# Patient Record
Sex: Female | Born: 1965 | Race: Asian | Hispanic: No | Marital: Married | Smoking: Never smoker
Health system: Southern US, Community
[De-identification: ages and names within clinical notes are randomized; demographics above are authoritative.]

## PROBLEM LIST (undated history)

## (undated) DIAGNOSIS — N200 Calculus of kidney: Secondary | ICD-10-CM

## (undated) DIAGNOSIS — T7840XA Allergy, unspecified, initial encounter: Secondary | ICD-10-CM

## (undated) DIAGNOSIS — M8580 Other specified disorders of bone density and structure, unspecified site: Secondary | ICD-10-CM

## (undated) DIAGNOSIS — Z86010 Personal history of colonic polyps: Secondary | ICD-10-CM

## (undated) DIAGNOSIS — M810 Age-related osteoporosis without current pathological fracture: Secondary | ICD-10-CM

## (undated) HISTORY — DX: Calculus of kidney: N20.0

## (undated) HISTORY — DX: Allergy, unspecified, initial encounter: T78.40XA

## (undated) HISTORY — DX: Other specified disorders of bone density and structure, unspecified site: M85.80

## (undated) HISTORY — DX: Age-related osteoporosis without current pathological fracture: M81.0

## (undated) HISTORY — DX: Personal history of colonic polyps: Z86.010

---

## 2014-01-21 ENCOUNTER — Ambulatory Visit: Payer: BC Managed Care – PPO | Admitting: Gynecology

## 2014-01-22 NOTE — Progress Notes (Signed)
NO SHOW

## 2014-02-04 ENCOUNTER — Ambulatory Visit (INDEPENDENT_AMBULATORY_CARE_PROVIDER_SITE_OTHER): Payer: BC Managed Care – PPO | Admitting: Gynecology

## 2014-02-04 ENCOUNTER — Encounter: Payer: Self-pay | Admitting: Gynecology

## 2014-02-04 ENCOUNTER — Other Ambulatory Visit (HOSPITAL_COMMUNITY)
Admission: RE | Admit: 2014-02-04 | Discharge: 2014-02-04 | Disposition: A | Discharge status: Home / Self Care | Payer: BC Managed Care – PPO | Source: Ambulatory Visit | Attending: Gynecology | Admitting: Gynecology

## 2014-02-04 VITALS — BP 108/70 | Ht 63.5 in | Wt 115.0 lb

## 2014-02-04 DIAGNOSIS — M8580 Other specified disorders of bone density and structure, unspecified site: Secondary | ICD-10-CM | POA: Insufficient documentation

## 2014-02-04 DIAGNOSIS — Z1151 Encounter for screening for human papillomavirus (HPV): Secondary | ICD-10-CM | POA: Insufficient documentation

## 2014-02-04 DIAGNOSIS — N926 Irregular menstruation, unspecified: Secondary | ICD-10-CM

## 2014-02-04 DIAGNOSIS — M949 Disorder of cartilage, unspecified: Secondary | ICD-10-CM

## 2014-02-04 DIAGNOSIS — M899 Disorder of bone, unspecified: Secondary | ICD-10-CM

## 2014-02-04 DIAGNOSIS — Z01419 Encounter for gynecological examination (general) (routine) without abnormal findings: Secondary | ICD-10-CM

## 2014-02-04 DIAGNOSIS — N951 Menopausal and female climacteric states: Secondary | ICD-10-CM

## 2014-02-04 NOTE — Progress Notes (Signed)
Samantha Peterson 1966/07/31 [id number]   History:    48 y.o.  for annual gyn exam who is a new patient to the practice. Patient's last gynecological exam was over 6 years ago in [place] she denied any past history of abnormal Pap smear. She is a gravida 4 para 2 AB 2 (first 2 pregnancies resulted in premature birth at 41 months and fetuses died. Third and fourth pregnancies were delivered vaginally one in 9 months and the other one at 7 months gestation. Her husband has had a vasectomy. She states that last year and now for the past 2 months she has been having 2 periods per month. She is taking no medication at the present time. She did state Demerol A. approximately 4 years ago they did a bone density study and: She was osteopenic. She denies any vasomotor symptoms.  Past medical history,surgical history, family history and social history were all reviewed and documented in the EPIC chart.  Gynecologic History Patient's last menstrual period was 01/01/2014. Contraception: vasectomy Last Pap: 6 years ago. Results were: Normal in [place] according to patient Last mammogram: 5 years ago. Results were: Normal  Obstetric History OB History  Gravida Para Term Preterm AB SAB TAB Ectopic Multiple Living  4 2   2 2    2     # Outcome Date GA Lbr Len/2nd Weight Sex Delivery Anes PTL Lv  4 SAB           3 SAB           2 PAR           1 PAR                ROS: A ROS was performed and pertinent positives and negatives are included in the history.  GENERAL: No fevers or chills. HEENT: No change in vision, no earache, sore throat or sinus congestion. NECK: No pain or stiffness. CARDIOVASCULAR: No chest pain or pressure. No palpitations. PULMONARY: No shortness of breath, cough or wheeze. GASTROINTESTINAL: No abdominal pain, nausea, vomiting or diarrhea, melena or bright red blood per rectum. GENITOURINARY: No urinary frequency, urgency, hesitancy or dysuria. MUSCULOSKELETAL: No joint or muscle pain, no back  pain, no recent trauma. DERMATOLOGIC: No rash, no itching, no lesions. ENDOCRINE: No polyuria, polydipsia, no heat or cold intolerance. No recent change in weight. HEMATOLOGICAL: No anemia or easy bruising or bleeding. NEUROLOGIC: No headache, seizures, numbness, tingling or weakness. PSYCHIATRIC: No depression, no loss of interest in normal activity or change in sleep pattern.     Exam: chaperone present  BP 108/70  Ht 5' 3.5" (1.613 m)  Wt 115 lb (52.164 kg)  BMI 20.05 kg/m2  LMP 01/01/2014  Body mass index is 20.05 kg/(m^2).  General appearance : Well developed well nourished female. No acute distress HEENT: Neck supple, trachea midline, no carotid bruits, no thyroidmegaly Lungs: Clear to auscultation, no rhonchi or wheezes, or rib retractions  Heart: Regular rate and rhythm, no murmurs or gallops Breast:Examined in sitting and supine position were symmetrical in appearance, no palpable masses or tenderness,  no skin retraction, no nipple inversion, no nipple discharge, no skin discoloration, no axillary or supraclavicular lymphadenopathy Abdomen: no palpable masses or tenderness, no rebound or guarding Extremities: no edema or skin discoloration or tenderness  Pelvic:  Bartholin, Urethra, Skene Glands: Within normal limits             Vagina: No gross lesions or discharge  Cervix: No gross lesions or  discharge  Uterus  anteverted, normal size, shape and consistency, non-tender and mobile  Adnexa  Without masses or tenderness  Anus and perineum  normal   Rectovaginal  normal sphincter tone without palpated masses or tenderness             Hemoccult not indicated   Patient was counseled for an endometrial biopsy after the Pap smear was obtained. The cervix was cleansed with Betadine solution. A sterile Pipelle was introduced into the uterine cavity and tissue submitted for histological evaluation.  Assessment/Plan:  48 y.o. female for annual exam who is perimenopausal at the age  of 37 with no vasomotor symptoms irregular cycles whereby she has been having 2 per month. To rule out endometrial hyperplasia and endometrial biopsy was done today. To complete the evaluation for irregular bleeding she'll return back to the office next week for sonohysterogram. Will check an [facility] today. Patient's PCP will be drawn her blood work. Requisition to schedule mammogram was provided. We discussed importance of monthly self breast examination. We discussed importance of calcium and vitamin D and regular exercise for osteoporosis prevention. Past HPV screen done today.  Note: This dictation was prepared with  Dragon/digital dictation along withSmart phrase technology. Any transcriptional errors that result from this process are unintentional.   Terrance Mass MD, 12:03 PM 02/04/2014

## 2014-02-04 NOTE — Patient Instructions (Signed)
Transvaginal Ultrasound Transvaginal ultrasound is a pelvic ultrasound, using a metal probe that is placed in the vagina, to look at a women's female organs. Transvaginal ultrasound is a method of seeing inside the pelvis of a woman. The ultrasound machine sends out sound waves from the transducer (probe). These sound waves bounce off body structures (like an echo) to create a picture. The picture shows up on a monitor. It is called transvaginal because the probe is inserted into the vagina. There should be very little discomfort from the vaginal probe. This test can also be used during pregnancy. Endovaginal ultrasound is another name for a transvaginal ultrasound. In a transabdominal ultrasound, the probe is placed on the outside of the belly. This method gives pictures that are lower quality than pictures from the transvaginal technique. Transvaginal ultrasound is used to look for problems of the female genital tract. Some such problems include:  Infertility problems.  Congenital (birth defect) malformations of the uterus and ovaries.  Tumors in the uterus.  Abnormal bleeding.  Ovarian tumors and cysts.  Abscess (inflamed tissue around pus) in the pelvis.  Unexplained abdominal or pelvic pain.  Pelvic infection. DURING PREGNANCY, TRANSVAGINAL ULTRASOUND MAY BE USED TO LOOK AT:  Normal pregnancy.  Ectopic pregnancy (pregnancy outside the uterus).  Fetal heartbeat.  Abnormalities in the pelvis, that are not seen well with transabdominal ultrasound.  Suspected twins or multiples.  Impending miscarriage.  Problems with the cervix (incompetent cervix, not able to stay closed and hold the baby).  When doing an amniocentesis (removing fluid from the pregnancy sac, for testing).  Looking for abnormalities of the baby.  Checking the growth, development, and age of the fetus.  Measuring the amount of fluid in the amniotic sac.  When doing an external version of the baby (moving  baby into correct position).  Evaluating the baby for problems in high risk pregnancies (biophysical profile).  Suspected fetal demise (death). Sometimes a special ultrasound method called Saline Infusion Sonography (SIS) is used for a more accurate look at the uterus. Sterile saline (salt water) is injected into the uterus of non-pregnant patients to see the inside of the uterus better. SIS is not used on pregnant women. The vaginal probe can also assist in obtaining biopsies of abnormal areas, in draining fluid from cysts on the ovary, and in finding IUDs (intrauterine device, birth control) that cannot be located. PREPARATION FOR TEST A transvaginal ultrasound is done with the bladder empty. The transabdominal ultrasound is done with your bladder full. You may be asked to drink several glasses of water before that exam. Sometimes, a transabdominal ultrasound is done just after a transvaginal ultrasound, to look at organs in your abdomen. PROCEDURE  You will lie down on a table, with your knees bent and your feet in foot holders. The probe is covered with a condom. A sterile lubricant is put into the vagina and on the probe. The lubricant helps transmit the sound waves and avoid irritating the vagina. Your caregiver will move the probe inside the vaginal cavity to scan the pelvic structures. A normal test will show a normal pelvis and normal contents. An abnormal test will show abnormalities of the pelvis, placenta, or baby. ABNORMAL RESULTS MAY BE DUE TO:  Growths or tumors in the:  Uterus.  Ovaries.  Vagina.  Other pelvic structures.  Non-cancerous growths of the uterus and ovaries.  Twisting of the ovary, cutting off blood supply to the ovary (ovarian torsion).  Areas of infection, including:  Pelvic  inflammatory disease.  Abscess in the pelvis.  Locating an IUD. PROBLEMS FOUND IN PREGNANT WOMEN MAY INCLUDE:  Ectopic pregnancy (pregnancy outside the uterus).  Multiple  pregnancies.  Early dilation (opening) of the cervix. This may indicate an incompetent cervix and early delivery.  Impending miscarriage.  Fetal death.  Problems with the placenta, including:  Placenta has grown over the opening of the womb (placenta previa).  Placenta has separated early in the womb (placental abruption).  Placenta grows into the muscle of the uterus (placenta accreta).  Tumors of pregnancy, including gestational trophoblastic disease. This is an abnormal pregnancy, with no fetus. The uterus is filled with many grape-like cysts that could sometimes be cancerous.  Incorrect position of the fetus (breech, vertex).  Intrauterine fetal growth retardation (IUGR) (poor growth in the womb).  Fetal abnormalities or infection. RISKS AND COMPLICATIONS There are no known risks to the ultrasound procedure. There is no X-ray used when doing an ultrasound. Document Released: 09/05/2004 Document Revised: 12/17/2011 Document Reviewed: 08/24/2009 [facility] Patient Information 2014 [place], [place].  Perimenopause Perimenopause is the time when your body begins to move into the menopause (no menstrual period for 12 straight months). It is a natural process. Perimenopause can begin 2 8 years before the menopause and usually lasts for 1 year after the menopause. During this time, your ovaries may or may not produce an egg. The ovaries vary in their production of estrogen and progesterone hormones each month. This can cause irregular menstrual periods, difficulty getting pregnant, vaginal bleeding between periods, and uncomfortable symptoms. CAUSES  Irregular production of the ovarian hormones, estrogen and progesterone, and not ovulating every month.  Other causes include:  Tumor of the pituitary gland in the brain.  Medical disease that affects the ovaries.  Radiation treatment.  Chemotherapy.  Unknown causes.  Heavy smoking and excessive alcohol intake can bring on  perimenopause sooner. SIGNS AND SYMPTOMS   Hot flashes.  Night sweats.  Irregular menstrual periods.  Decreased sex drive.  Vaginal dryness.  Headaches.  Mood swings.  Depression.  Memory problems.  Irritability.  Tiredness.  Weight gain.  Trouble getting pregnant.  The beginning of losing bone cells (osteoporosis).  The beginning of hardening of the arteries (atherosclerosis). DIAGNOSIS  Your health care provider will make a diagnosis by analyzing your age, menstrual history, and symptoms. He or she will do a physical exam and note any changes in your body, especially your female organs. Female hormone tests may or may not be helpful depending on the amount of female hormones you produce and when you produce them. However, other hormone tests may be helpful to rule out other problems. TREATMENT  In some cases, no treatment is needed. The decision on whether treatment is necessary during the perimenopause should be made by you and your health care provider based on how the symptoms are affecting you and your lifestyle. Various treatments are available, such as:  Treating individual symptoms with a specific medicine for that symptom.  Herbal medicines that can help specific symptoms.  Counseling.  Group therapy. HOME CARE INSTRUCTIONS   Keep track of your menstrual periods (when they occur, how heavy they are, how long between periods, and how long they last) as well as your symptoms and when they started.  Only take over-the-counter or prescription medicines as directed by your health care provider.  Sleep and rest.  Exercise.  Eat a diet that contains calcium (good for your bones) and soy (acts like the estrogen hormone).  Do not  smoke.  Avoid alcoholic beverages.  Take vitamin supplements as recommended by your health care provider. Taking vitamin E may help in certain cases.  Take calcium and vitamin D supplements to help prevent bone loss.  Group  therapy is sometimes helpful.  Acupuncture may help in some cases. SEEK MEDICAL CARE IF:   You have questions about any symptoms you are having.  You need a referral to a specialist (gynecologist, psychiatrist, or psychologist). SEEK IMMEDIATE MEDICAL CARE IF:   You have vaginal bleeding.  Your period lasts longer than 8 days.  Your periods are recurring sooner than 21 days.  You have bleeding after intercourse.  You have severe depression.  You have pain when you urinate.  You have severe headaches.  You have vision problems. Document Released: 11/01/2004 Document Revised: 07/15/2013 Document Reviewed: 04/23/2013 [facility] Patient Information 2014 [place], [place]. Endometrial Biopsy Endometrial biopsy is a procedure in which a tissue sample is taken from inside the uterus. The tissue sample is then looked at under a microscope to see if the tissue is normal or abnormal. The endometrium is the lining of the uterus. This procedure helps determine where you are in your menstrual cycle and how hormone levels are affecting the lining of the uterus. This procedure may also be used to evaluate uterine bleeding or to diagnose endometrial cancer, tuberculosis, polyps, or inflammatory conditions.  LET [facility] CARE PROVIDER KNOW ABOUT:  Any allergies you have.  All medicines you are taking, including vitamins, herbs, eye drops, creams, and over-the-counter medicines.  Previous problems you or members of your family have had with the use of anesthetics.  Any blood disorders you have.  Previous surgeries you have had.  Medical conditions you have.  Possibility of pregnancy. RISKS AND COMPLICATIONS Generally, this is a safe procedure. However, as with any procedure, complications can occur. Possible complications include:  Bleeding.  Pelvic infection.  Puncture of the uterine wall with the biopsy device (rare). BEFORE THE PROCEDURE   Keep a record of your menstrual cycles  as directed by your health care provider. You may need to schedule your procedure for a specific time in your cycle.  You may want to bring a sanitary pad to wear home after the procedure.  Arrange for someone to drive you home after the procedure if you will be given a medicine to help you relax (sedative). PROCEDURE   You may be given a sedative to relax you.  You will lie on an exam table with your feet and legs supported as in a pelvic exam.  Your health care provider will insert an instrument (speculum) into your vagina to see your cervix.  Your cervix will be cleansed with an antiseptic solution. A medicine (local anesthetic) will be used to numb the cervix.  A forceps instrument (tenaculum) will be used to hold your cervix steady for the biopsy.  A thin, rodlike instrument (uterine sound) will be inserted through your cervix to determine the length of your uterus and the location where the biopsy sample will be removed.  A thin, flexible tube (catheter) will be inserted through your cervix and into the uterus. The catheter is used to collect the biopsy sample from your endometrial tissue.  The catheter and speculum will then be removed, and the tissue sample will be sent to a lab for examination. AFTER THE PROCEDURE  You will rest in a recovery area until you are ready to go home.  You may have mild cramping and a small  amount of vaginal bleeding for a few days after the procedure. This is normal.  Make sure you find out how to get your test results. Document Released: 01/25/2005 Document Revised: 05/27/2013 Document Reviewed: 03/11/2013 [facility] Patient Information 2014 [place], [place].

## 2014-02-04 NOTE — Addendum Note (Signed)
Addended by: Su Grand A on: 02/04/2014 12:09 PM   Modules accepted: Orders

## 2014-02-05 LAB — FOLLICLE STIMULATING HORMONE: FSH: 3.1 m[IU]/mL

## 2014-02-15 ENCOUNTER — Other Ambulatory Visit: Payer: Self-pay | Admitting: Gynecology

## 2014-02-15 DIAGNOSIS — N921 Excessive and frequent menstruation with irregular cycle: Secondary | ICD-10-CM

## 2014-02-15 DIAGNOSIS — N926 Irregular menstruation, unspecified: Secondary | ICD-10-CM

## 2014-02-17 ENCOUNTER — Other Ambulatory Visit: Payer: BC Managed Care – PPO

## 2014-02-17 ENCOUNTER — Ambulatory Visit: Payer: BC Managed Care – PPO | Admitting: Gynecology

## 2014-02-22 ENCOUNTER — Other Ambulatory Visit: Payer: BC Managed Care – PPO

## 2014-02-22 ENCOUNTER — Ambulatory Visit: Payer: BC Managed Care – PPO | Admitting: Gynecology

## 2014-04-28 ENCOUNTER — Ambulatory Visit: Payer: BC Managed Care – PPO | Admitting: Gynecology

## 2014-04-28 ENCOUNTER — Other Ambulatory Visit: Payer: BC Managed Care – PPO

## 2014-04-28 ENCOUNTER — Telehealth: Payer: Self-pay | Admitting: Gynecology

## 2014-04-28 NOTE — Telephone Encounter (Signed)
04/28/14-Pt was late for her [facility] so I called her to see if she was coming. With the help of a [place] interpreter it was brought out that patient had forgotten about appt even though the phone tree showed someone answered the reminder call. Pt was informed of $50.00 fee through the interpreter but said she doesn't want to proceed with the test and she was doing fine and will call if she wants to reschedule it/[facility]

## 2014-06-01 ENCOUNTER — Encounter: Payer: Self-pay | Admitting: Internal Medicine

## 2014-06-01 ENCOUNTER — Ambulatory Visit (INDEPENDENT_AMBULATORY_CARE_PROVIDER_SITE_OTHER): Payer: No Typology Code available for payment source | Admitting: Internal Medicine

## 2014-06-01 VITALS — BP 112/64 | HR 60 | Temp 98.9°F | Ht 63.25 in | Wt 114.0 lb

## 2014-06-01 DIAGNOSIS — I8390 Asymptomatic varicose veins of unspecified lower extremity: Secondary | ICD-10-CM

## 2014-06-01 DIAGNOSIS — M899 Disorder of bone, unspecified: Secondary | ICD-10-CM

## 2014-06-01 DIAGNOSIS — R202 Paresthesia of skin: Secondary | ICD-10-CM

## 2014-06-01 DIAGNOSIS — Z23 Encounter for immunization: Secondary | ICD-10-CM

## 2014-06-01 DIAGNOSIS — R209 Unspecified disturbances of skin sensation: Secondary | ICD-10-CM

## 2014-06-01 DIAGNOSIS — M949 Disorder of cartilage, unspecified: Secondary | ICD-10-CM

## 2014-06-01 DIAGNOSIS — Z0000 Encounter for general adult medical examination without abnormal findings: Secondary | ICD-10-CM

## 2014-06-01 DIAGNOSIS — M8580 Other specified disorders of bone density and structure, unspecified site: Secondary | ICD-10-CM

## 2014-06-01 LAB — CBC WITH DIFFERENTIAL/PLATELET
BASOS PCT: 0.4 % (ref 0.0–3.0)
Basophils Absolute: 0 10*3/uL (ref 0.0–0.1)
EOS PCT: 3 % (ref 0.0–5.0)
Eosinophils Absolute: 0.2 10*3/uL (ref 0.0–0.7)
HCT: 30.6 % — ABNORMAL LOW (ref 36.0–46.0)
Hemoglobin: 9.6 g/dL — ABNORMAL LOW (ref 12.0–15.0)
LYMPHS PCT: 37.6 % (ref 12.0–46.0)
Lymphs Abs: 2.2 10*3/uL (ref 0.7–4.0)
MCHC: 31.5 g/dL (ref 30.0–36.0)
MCV: 73.6 fl — AB (ref 78.0–100.0)
MONO ABS: 0.4 10*3/uL (ref 0.1–1.0)
MONOS PCT: 6.7 % (ref 3.0–12.0)
NEUTROS PCT: 52.3 % (ref 43.0–77.0)
Neutro Abs: 3 10*3/uL (ref 1.4–7.7)
Platelets: 213 10*3/uL (ref 150.0–400.0)
RBC: 4.16 Mil/uL (ref 3.87–5.11)
RDW: 18.1 % — ABNORMAL HIGH (ref 11.5–15.5)
WBC: 5.8 10*3/uL (ref 4.0–10.5)

## 2014-06-01 NOTE — Patient Instructions (Signed)
Our office will contact you re: blood test and bone density scan results Use over the counter compression hose for symptomatic varicose veins.

## 2014-06-01 NOTE — Assessment & Plan Note (Addendum)
Reviewed adult health maintenance protocols.  Patient up to date with PAP and Pelvic.  Arrange screening mammogram and follow up DEXA scan.  Check screening labs.  Tdap given.  She declines flu vaccine.   I recommended colon cancer screening at age 48.  Her lower extremity symptoms likely secondary to varicose veins.  Patient will try using compression hose.

## 2014-06-01 NOTE — Progress Notes (Signed)
Pre visit review using our clinic review tool, if applicable. No additional management support is needed unless otherwise documented below in the visit note. 

## 2014-06-01 NOTE — Progress Notes (Signed)
Subjective:    Patient ID: Samantha Peterson, female    DOB: 1965-10-28, 48 y.o.   MRN: [id number]  HPI  48 year old Asian female to establish care and for routine physical. Patient denies any significant medical history.  She denies any chronic illnesses. She does have remote issues with kidney stones in the past.  Patient was seen by her gynecologist in June of 2015. Her Pap and pelvic were unremarkable.  Family and social history reviewed.  She has history of osteopenia. She takes calcium and vitamin D supplementation.  Her last DEXA 5 years ago.  No family hx of osteoporotic fractures.  History obtained through [place]. Review of Systems  Constitutional: Negative for activity change, appetite change and unexpected weight change.  Eyes: Negative for visual disturbance.  Respiratory: Negative for cough, chest tightness and shortness of breath.   Cardiovascular: Negative for chest pain.  Genitourinary: Negative for difficulty urinating.  Neurological: Negative for headaches. Occasional tingling in her hands  Gastrointestinal: Negative for abdominal pain, heartburn melena or hematochezia Psych: Negative for depression or anxiety Musculoskeletal: intermittent pain in lower legs bilaterally.  Worse with prolonged standing      Past Medical History  Diagnosis Date  . Osteopenia   . Kidney stones     History   Social History  . Marital Status: Married    Spouse Name: N/A    Number of Children: N/A  . Years of Education: N/A   Occupational History  . Self Employed    Social History Main Topics  . Smoking status: Never Smoker   . Smokeless tobacco: Not on file  . Alcohol Use: No  . Drug Use: No  . Sexual Activity: Not on file   Other Topics Concern  . 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  Family History  Problem Relation Age of Onset   . Lung cancer Mother   . Tuberculosis Mother   . Breast cancer Neg Hx   . Colon cancer Neg Hx     No Known Allergies  Current Outpatient Prescriptions on File Prior to Visit  Medication Sig Dispense Refill  . Ascorbic Acid (VITAMIN C) 100 MG tablet Take 100 mg by mouth daily.      . cholecalciferol (VITAMIN D) 1000 UNITS tablet Take 1,000 Units by mouth daily.      . Cyanocobalamin (VITAMIN B 12 PO) Take by mouth.       No current facility-administered medications on file prior to visit.    BP 112/64  Pulse 60  Temp(Src) 98.9 F (37.2 C) (Oral)  Ht 5' 3.25" (1.607 m)  Wt 114 lb (51.71 kg)  BMI 20.02 kg/m2       Objective:   Physical Exam  Constitutional: She is oriented to person, place, and time. She appears well-developed and well-nourished. No distress.  HENT:  Head: Normocephalic and atraumatic.  Right Ear: External ear normal.  Left Ear: External ear normal.  Mouth/Throat: Oropharynx is clear and moist.  Hearing is grossly normal  Eyes: EOM are normal. Pupils are equal, round, and reactive to light. No scleral icterus.  Neck: Normal range of motion. Neck supple. No thyromegaly present.  No carotid bruit  Cardiovascular: Normal rate, regular rhythm, normal heart sounds and intact distal pulses.   No murmur heard. Bilateral lower extremity varicose veins  Pulmonary/Chest: Effort normal and breath sounds normal. She has no wheezes.  Abdominal: Soft. Bowel sounds are normal. She exhibits no mass. There is no tenderness.  Musculoskeletal: She exhibits no edema.  Lymphadenopathy:    She has no cervical adenopathy.  Neurological: She is alert and oriented to person, place, and time. No cranial nerve deficit.  Negative Tinel's and Phalen's test  Skin: Skin is warm and dry. No rash noted.  Psychiatric: She has a normal mood and affect. Her behavior is normal.          Assessment & Plan:

## 2014-06-02 LAB — BASIC METABOLIC PANEL
BUN: 11 mg/dL (ref 6–23)
CHLORIDE: 102 meq/L (ref 96–112)
CO2: 28 mEq/L (ref 19–32)
CREATININE: 0.5 mg/dL (ref 0.4–1.2)
Calcium: 9.1 mg/dL (ref 8.4–10.5)
GFR: 127.19 mL/min (ref >60.00)
Glucose, Bld: 79 mg/dL (ref 70–99)
Potassium: 3.8 mEq/L (ref 3.5–5.1)
Sodium: 136 mEq/L (ref 135–145)

## 2014-06-02 LAB — LIPID PANEL
CHOLESTEROL: 161 mg/dL (ref 0–200)
HDL: 63.4 mg/dL (ref >39.00)
LDL Cholesterol: 81 mg/dL (ref 0–99)
NonHDL: 97.6
TRIGLYCERIDES: 82 mg/dL (ref 0.0–149.0)
Total CHOL/HDL Ratio: 3
VLDL: 16.4 mg/dL (ref 0.0–40.0)

## 2014-06-02 LAB — TSH: TSH: 2.15 u[IU]/mL (ref 0.35–4.50)

## 2014-06-02 LAB — HEPATIC FUNCTION PANEL
ALK PHOS: 47 U/L (ref 39–117)
ALT: 11 U/L (ref 0–35)
AST: 19 U/L (ref 0–37)
Albumin: 3.9 g/dL (ref 3.5–5.2)
BILIRUBIN TOTAL: 0.5 mg/dL (ref 0.2–1.2)
Bilirubin, Direct: 0.1 mg/dL (ref 0.0–0.3)
TOTAL PROTEIN: 6.8 g/dL (ref 6.0–8.3)

## 2014-06-02 LAB — VITAMIN D 25 HYDROXY (VIT D DEFICIENCY, FRACTURES): VITD: 41.42 ng/mL (ref 30.00–100.00)

## 2014-06-02 LAB — VITAMIN B12: Vitamin B-12: >1500 pg/mL — ABNORMAL HIGH (ref 211–911)

## 2014-06-03 ENCOUNTER — Inpatient Hospital Stay: Admission: RE | Admit: 2014-06-03 | Payer: No Typology Code available for payment source | Source: Ambulatory Visit

## 2014-06-03 NOTE — Addendum Note (Signed)
Addended by: Elmer Picker on: 06/03/2014 05:06 PM   Modules accepted: Orders

## 2014-06-04 ENCOUNTER — Other Ambulatory Visit: Payer: Self-pay | Admitting: Internal Medicine

## 2014-06-04 ENCOUNTER — Ambulatory Visit: Payer: No Typology Code available for payment source

## 2014-06-04 DIAGNOSIS — D649 Anemia, unspecified: Secondary | ICD-10-CM

## 2014-06-04 DIAGNOSIS — Z1231 Encounter for screening mammogram for malignant neoplasm of breast: Secondary | ICD-10-CM

## 2014-06-04 LAB — IBC PANEL
IRON: 17 ug/dL — AB (ref 42–145)
Iron: 17 ug/dL — ABNORMAL LOW (ref 42–145)
SATURATION RATIOS: 3.7 % — AB (ref 20.0–50.0)
Saturation Ratios: 3.7 % — ABNORMAL LOW (ref 20.0–50.0)
Transferrin: 324 mg/dL (ref 212.0–360.0)
Transferrin: 326.9 mg/dL (ref 212.0–360.0)

## 2014-06-04 LAB — IRON: Iron: 17 ug/dL — ABNORMAL LOW (ref 42–145)

## 2014-06-18 ENCOUNTER — Ambulatory Visit: Payer: No Typology Code available for payment source

## 2014-06-18 DIAGNOSIS — Z0000 Encounter for general adult medical examination without abnormal findings: Secondary | ICD-10-CM

## 2014-06-18 LAB — FECAL OCCULT BLOOD, IMMUNOCHEMICAL: Fecal Occult Bld: NEGATIVE

## 2014-06-29 ENCOUNTER — Encounter: Payer: Self-pay | Admitting: *Deleted

## 2014-06-30 ENCOUNTER — Ambulatory Visit
Admission: RE | Admit: 2014-06-30 | Discharge: 2014-06-30 | Disposition: A | Discharge status: Home / Self Care | Payer: No Typology Code available for payment source | Source: Ambulatory Visit | Attending: Internal Medicine | Admitting: Internal Medicine

## 2014-06-30 DIAGNOSIS — Z1231 Encounter for screening mammogram for malignant neoplasm of breast: Secondary | ICD-10-CM

## 2014-08-09 ENCOUNTER — Encounter: Payer: Self-pay | Admitting: Internal Medicine

## 2014-09-27 ENCOUNTER — Ambulatory Visit (INDEPENDENT_AMBULATORY_CARE_PROVIDER_SITE_OTHER): Payer: No Typology Code available for payment source | Admitting: Internal Medicine

## 2014-09-27 ENCOUNTER — Encounter: Payer: Self-pay | Admitting: Internal Medicine

## 2014-09-27 VITALS — BP 126/80 | Temp 98.0°F | Ht 63.25 in | Wt 117.0 lb

## 2014-09-27 DIAGNOSIS — D508 Other iron deficiency anemias: Secondary | ICD-10-CM

## 2014-09-27 DIAGNOSIS — R52 Pain, unspecified: Secondary | ICD-10-CM

## 2014-09-27 DIAGNOSIS — Z87442 Personal history of urinary calculi: Secondary | ICD-10-CM | POA: Insufficient documentation

## 2014-09-27 DIAGNOSIS — D649 Anemia, unspecified: Secondary | ICD-10-CM | POA: Insufficient documentation

## 2014-09-27 DIAGNOSIS — R109 Unspecified abdominal pain: Secondary | ICD-10-CM

## 2014-09-27 DIAGNOSIS — R319 Hematuria, unspecified: Secondary | ICD-10-CM

## 2014-09-27 LAB — POCT URINALYSIS DIPSTICK
Bilirubin, UA: NEGATIVE
GLUCOSE UA: NEGATIVE
Ketones, UA: NEGATIVE
NITRITE UA: NEGATIVE
Protein, UA: NEGATIVE
Spec Grav, UA: 1.02
UROBILINOGEN UA: 0.2
pH, UA: 7

## 2014-09-27 LAB — POCT INFLUENZA A/B
INFLUENZA A, POC: NEGATIVE
INFLUENZA B, POC: NEGATIVE

## 2014-09-27 MED ORDER — HYDROCODONE-ACETAMINOPHEN 5-325 MG PO TABS: ORAL_TABLET | ORAL | Status: DC

## 2014-09-27 NOTE — Assessment & Plan Note (Signed)
48 year old Asian female complains of intermittent squeezing-like sensation in her left flank. She had similar symptoms with nephrolithiasis over a year ago. Her symptoms are less severe. Her UA is positive for 2+ blood. She is experiencing body aches and flu like symptoms.  Nasal swab for influenza A and B is negative. Unclear whether her symptoms from early UTI.  Send urine for culture.  Check CBCD and BMET.  Obtain CT of abd and pelvis to look for renal stones.  Hydrate and use vicodin as directed for now.

## 2014-09-27 NOTE — Patient Instructions (Signed)
Increase fluid intake Our office will contact you re: CT of Abdomen and Pelvis results

## 2014-09-27 NOTE — Progress Notes (Signed)
   Subjective:    Patient ID: Samantha Peterson, female    DOB: [birth year], 48 y.o.   MRN: [id number]  HPI  48 year old female with history of iron deficiency anemia and kidney stones complains of Intermittent left flank pain for 2 weeks. Patient reports her last episode of severe pain associated with kidney stones was over a year ago. She describes a squeezing-like sensation in her left flank area. Her symptoms are not severe. Patient denies any urinary complaints.  Unfortunately during her last episode of nephrolithiasis, stone analysis was not performed.  She also complains of body aches and flu like symptoms.   Review of Systems Negative for urinary frequency, negative for dysuria Negative for sore throat, mild nasal congestion.  No sick contacts    Past Medical History  Diagnosis Date  . Osteopenia   . Kidney stones     History   Social History  . Marital Status: Married    Spouse Name: N/A    Number of Children: N/A  . Years of Education: N/A   Occupational History  . Self Employed    Social History Main Topics  . Smoking status: Never Smoker   . Smokeless tobacco: Not on file  . Alcohol Use: No  . Drug Use: No  . Sexual Activity: Not on file   Other Topics Concern  . 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  Family History  Problem Relation Age of Onset  . Lung cancer Mother   . Tuberculosis Mother   . Breast cancer Neg Hx   . Colon cancer Neg Hx     No Known Allergies  Current Outpatient Prescriptions on File Prior to Visit  Medication Sig Dispense Refill  . Ascorbic Acid (VITAMIN C) 100 MG tablet Take 100 mg by mouth daily.    . cholecalciferol (VITAMIN D) 1000 UNITS tablet Take 1,000 Units by mouth daily.    . Cyanocobalamin (VITAMIN B 12 PO) Take by mouth.     No current facility-administered medications on file prior to visit.     BP 126/80 mmHg  Temp(Src) 98 F (36.7 C) (Oral)  Ht 5' 3.25" (1.607 m)  Wt 117 lb (53.071 kg)  BMI 20.55 kg/m2    Objective:   Physical Exam  Constitutional: She is oriented to person, place, and time. She appears well-developed and well-nourished. No distress.  Cardiovascular: Normal rate, regular rhythm and normal heart sounds.   No murmur heard. Pulmonary/Chest: Effort normal and breath sounds normal. She has no wheezes.  Abdominal: Soft. Bowel sounds are normal. She exhibits no mass. There is no tenderness.  No flank tenderness  Neurological: She is alert and oriented to person, place, and time. No cranial nerve deficit.  Psychiatric: She has a normal mood and affect.          Assessment & Plan:

## 2014-09-27 NOTE — Progress Notes (Signed)
Pre visit review using our clinic review tool, if applicable. No additional management support is needed unless otherwise documented below in the visit note. 

## 2014-09-27 NOTE — Assessment & Plan Note (Signed)
48 year old Asian female with iron deficiency anemia. iFOB was negative.  Patient was instructed to start oral iron supplements. Monitor anemia. Lab Results  Component Value Date   WBC 5.8 06/01/2014   HGB 9.6* 06/01/2014   HCT 30.6* 06/01/2014   MCV 73.6* 06/01/2014   PLT 213.0 06/01/2014

## 2014-09-28 ENCOUNTER — Other Ambulatory Visit: Payer: Self-pay | Admitting: *Deleted

## 2014-09-28 ENCOUNTER — Ambulatory Visit (INDEPENDENT_AMBULATORY_CARE_PROVIDER_SITE_OTHER)
Admission: RE | Admit: 2014-09-28 | Discharge: 2014-09-28 | Disposition: A | Discharge status: Home / Self Care | Payer: No Typology Code available for payment source | Source: Ambulatory Visit | Attending: Internal Medicine | Admitting: Internal Medicine

## 2014-09-28 DIAGNOSIS — R109 Unspecified abdominal pain: Secondary | ICD-10-CM

## 2014-09-28 DIAGNOSIS — R319 Hematuria, unspecified: Secondary | ICD-10-CM

## 2014-09-28 LAB — CBC WITH DIFFERENTIAL/PLATELET
BASOS ABS: 0.1 10*3/uL (ref 0.0–0.1)
Basophils Relative: 1.1 % (ref 0.0–3.0)
EOS PCT: 3.6 % (ref 0.0–5.0)
Eosinophils Absolute: 0.2 10*3/uL (ref 0.0–0.7)
HCT: 42.6 % (ref 36.0–46.0)
Hemoglobin: 13.9 g/dL (ref 12.0–15.0)
LYMPHS ABS: 2 10*3/uL (ref 0.7–4.0)
Lymphocytes Relative: 32.5 % (ref 12.0–46.0)
MCHC: 32.8 g/dL (ref 30.0–36.0)
MCV: 93 fl (ref 78.0–100.0)
Monocytes Absolute: 0.3 10*3/uL (ref 0.1–1.0)
Monocytes Relative: 5.5 % (ref 3.0–12.0)
Neutro Abs: 3.6 10*3/uL (ref 1.4–7.7)
Neutrophils Relative %: 57.3 % (ref 43.0–77.0)
Platelets: 184 10*3/uL (ref 150.0–400.0)
RBC: 4.58 Mil/uL (ref 3.87–5.11)
RDW: 13.5 % (ref 11.5–15.5)
WBC: 6.2 10*3/uL (ref 4.0–10.5)

## 2014-09-28 LAB — BASIC METABOLIC PANEL
BUN: 18 mg/dL (ref 6–23)
CHLORIDE: 105 meq/L (ref 96–112)
CO2: 25 meq/L (ref 19–32)
CREATININE: 0.6 mg/dL (ref 0.4–1.2)
Calcium: 8.8 mg/dL (ref 8.4–10.5)
GFR: 108.31 mL/min (ref >60.00)
GLUCOSE: 87 mg/dL (ref 70–99)
Potassium: 4.1 mEq/L (ref 3.5–5.1)
Sodium: 136 mEq/L (ref 135–145)

## 2014-09-28 LAB — SEDIMENTATION RATE: Sed Rate: 5 mm/hr (ref 0–22)

## 2014-09-28 MED ORDER — CEPHALEXIN 250 MG PO CAPS: 250.0000 mg | ORAL_CAPSULE | Freq: Two times a day (BID) | ORAL | Status: DC

## 2014-09-29 LAB — URINE CULTURE: Colony Count: 50000

## 2015-05-21 IMAGING — CT CT RENAL STONE PROTOCOL
2 of 4 series · 17 of 46 positions shown, 19 images · non-contrast
Comparison: None.

CLINICAL DATA: Left flank pain and microscopic hematuria for 2
weeks, history of renal calculi.

EXAM:
CT ABDOMEN AND PELVIS WITHOUT CONTRAST
TECHNIQUE: Multidetector CT imaging of the abdomen and pelvis was performed
following the standard protocol without IV contrast.

[Series 2: 5mm stone study - 160 eff. mas · axial · 0.60mm/px · z∈[-432,-37]mm · 14 of 87 slices shown, 16 images]
[im 4/87  soft-tissue]
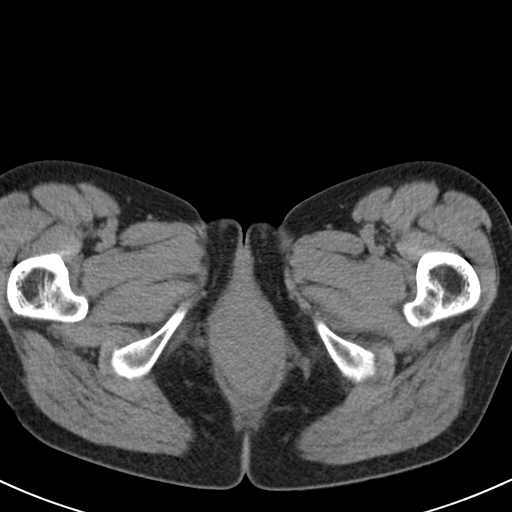
[im 4/87  bone]
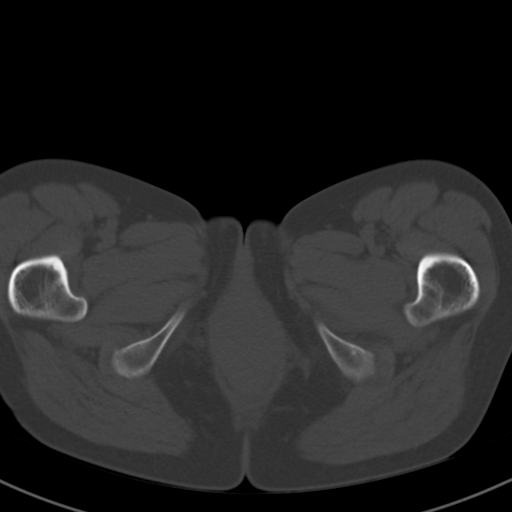
[im 10/87  soft-tissue]
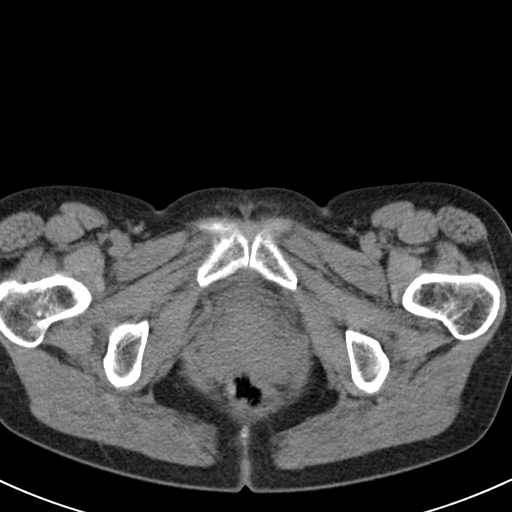
[im 17/87  soft-tissue]
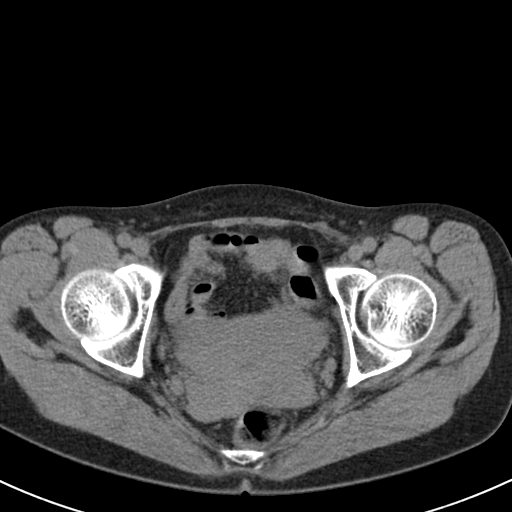
[im 24/87  soft-tissue]
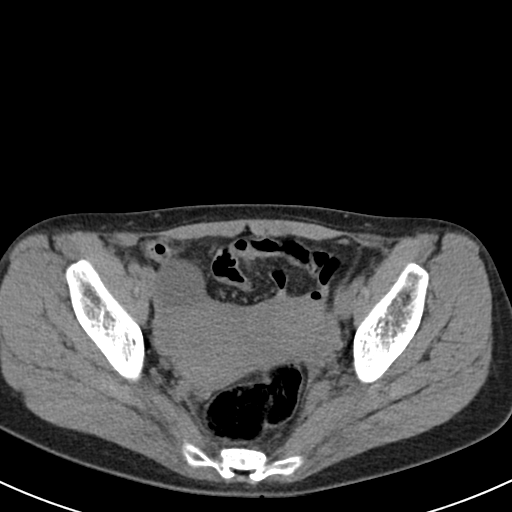
[im 30/87  soft-tissue]
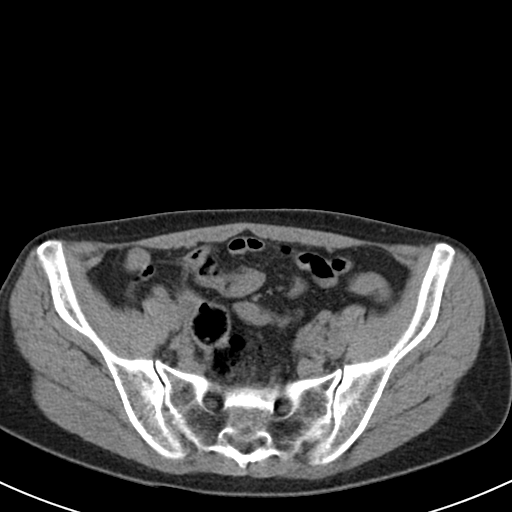
[im 34/87  soft-tissue]
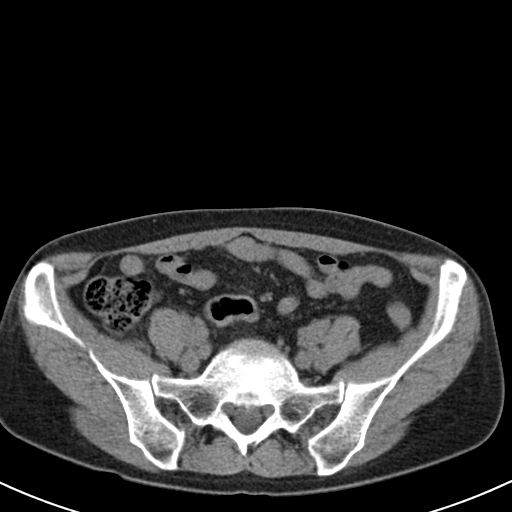
[im 40/87  soft-tissue]
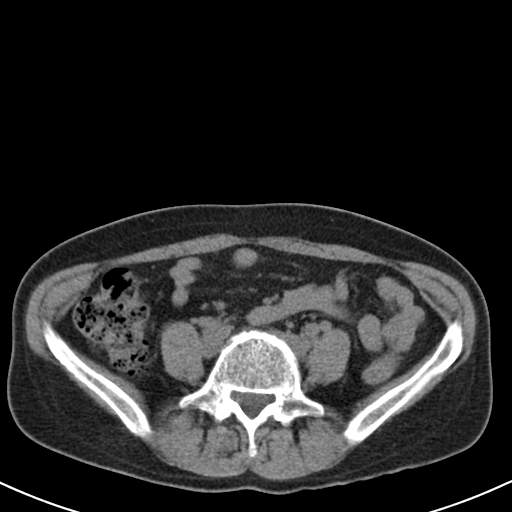
[im 47/87  soft-tissue]
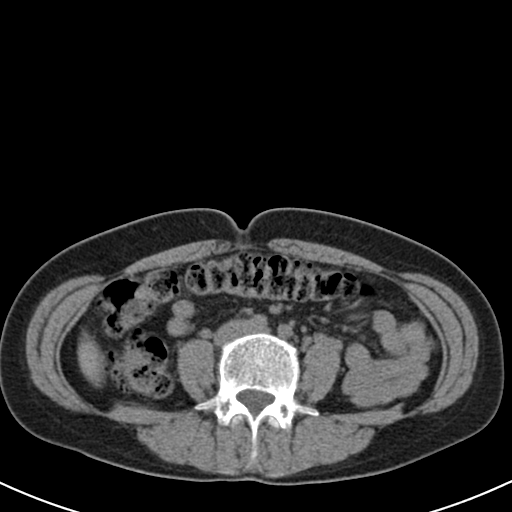
[im 53/87  soft-tissue]
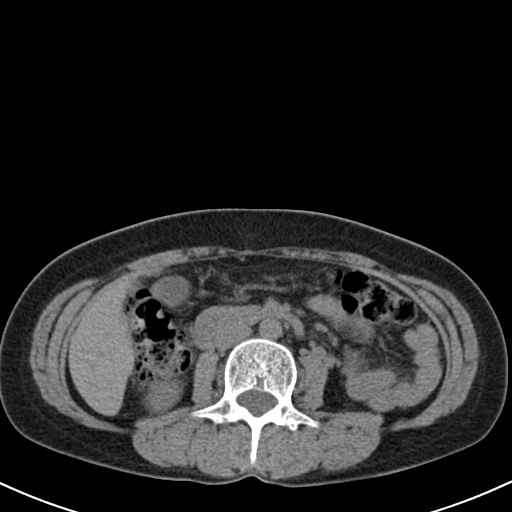
[im 53/87  bone]
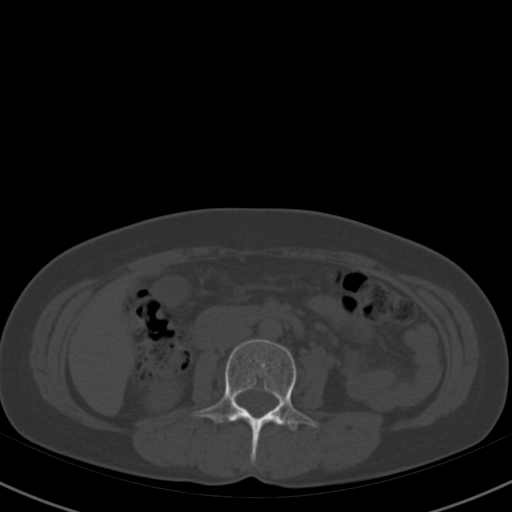
[im 57/87  soft-tissue]
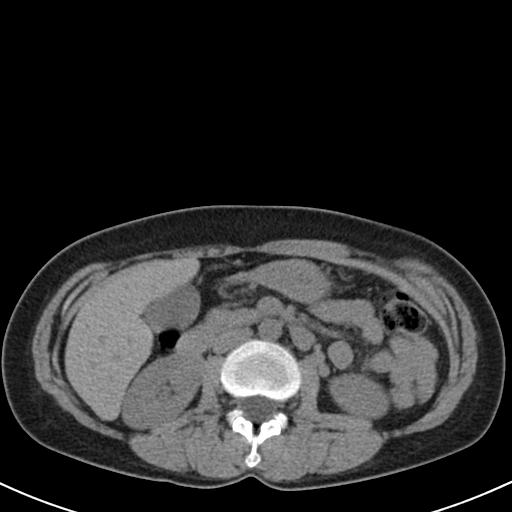
[im 63/87  soft-tissue]
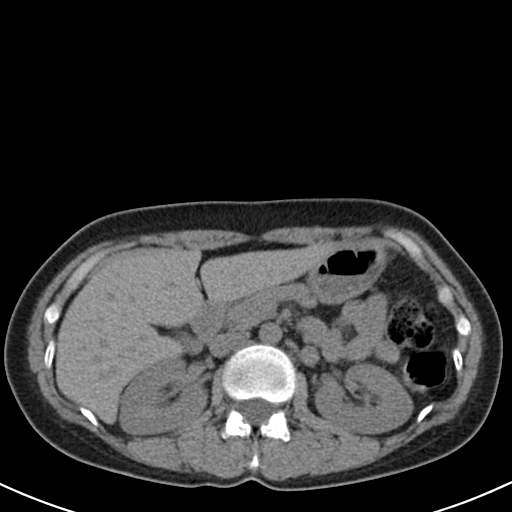
[im 70/87  soft-tissue]
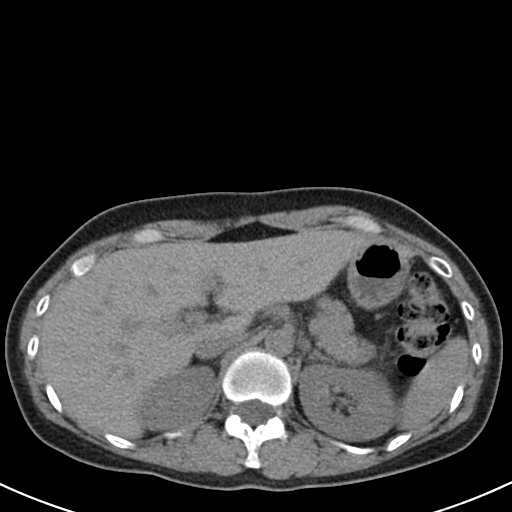
[im 77/87  soft-tissue]
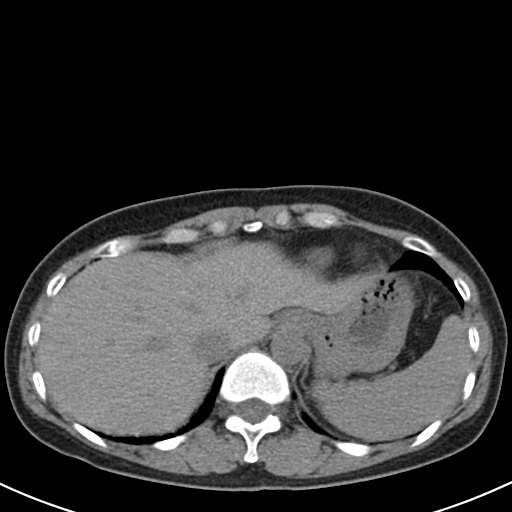
[im 83/87  soft-tissue]
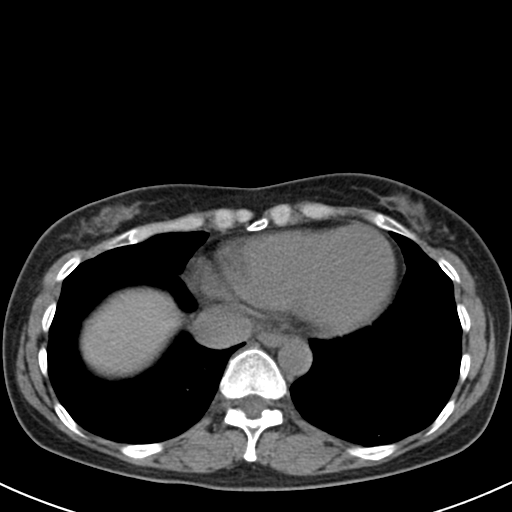

[Series 602: cor · coronal · 0.86mm/px · 3 of 96 slices shown]
[im 32/96  soft-tissue]
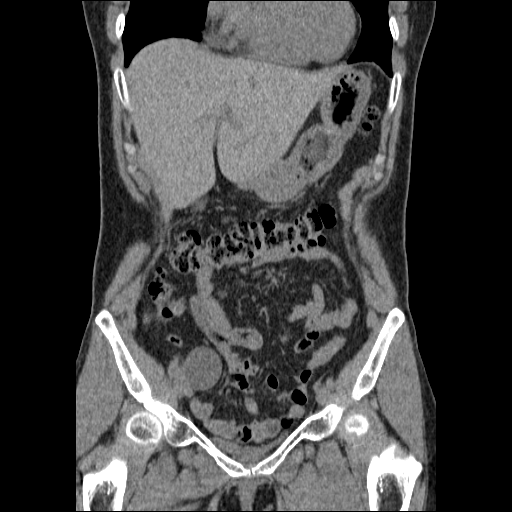
[im 43/96  soft-tissue]
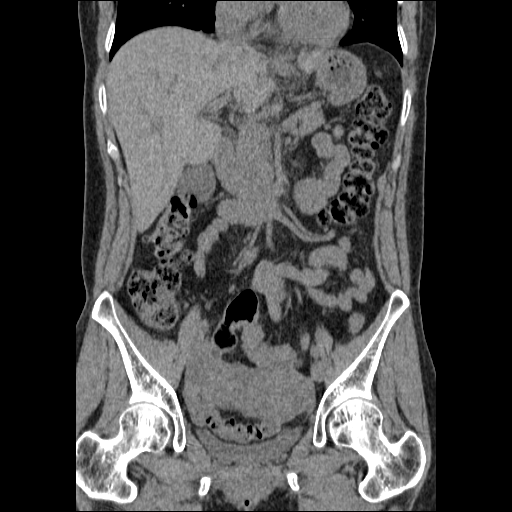
[im 53/96  soft-tissue]
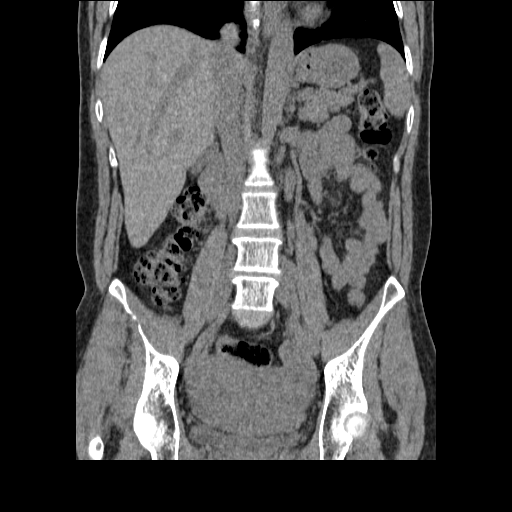

[17 of 46 positions shown; findings below may reference images not displayed]

FINDINGS: The lung bases are free of acute infiltrate or sizable effusion.

The liver, gallbladder, spleen, adrenal glands and pancreas are all
normal in their CT appearance. The kidneys are well visualized
bilaterally. A tiny 1-2 mm stone is noted in the lower pole of the
left kidney. No obstructive changes are seen. The bladder is
partially distended. No calculi are in the ureters. There is a
calcification noted in the midline below the bladder which may
represent a stone within the urethra.

The appendix is within normal limits. No significant diverticular
disease is seen. The uterus is mildly bulky likely related to
uterine fibroid change. A dominant right ovarian cyst is noted which
measures approximately 4 cm in greatest dimension. No free pelvic
fluid is noted. The osseous structures are within normal limits.
IMPRESSION: Small nonobstructing left lower pole renal stone.

No obstructive changes are seen.

Calcification in the midline just below the bladder which may
represent a stone within the urethra.

Bulky uterus likely related to fibroid change.

Right ovarian cyst as described.

These results will be called to the ordering clinician or
representative by the Radiologist Assistant, and communication
documented in the PACS or zVision Dashboard.

## 2015-10-21 ENCOUNTER — Ambulatory Visit (INDEPENDENT_AMBULATORY_CARE_PROVIDER_SITE_OTHER): Payer: BLUE CROSS/BLUE SHIELD | Admitting: Internal Medicine

## 2015-10-21 ENCOUNTER — Encounter: Payer: Self-pay | Admitting: Internal Medicine

## 2015-10-21 VITALS — BP 100/70 | HR 60 | Temp 97.9°F | Wt 114.6 lb

## 2015-10-21 DIAGNOSIS — M25512 Pain in left shoulder: Secondary | ICD-10-CM | POA: Diagnosis not present

## 2015-10-21 DIAGNOSIS — R208 Other disturbances of skin sensation: Secondary | ICD-10-CM | POA: Diagnosis not present

## 2015-10-21 DIAGNOSIS — R200 Anesthesia of skin: Secondary | ICD-10-CM

## 2015-10-21 MED ORDER — PREDNISONE 10 MG PO TABS: ORAL_TABLET | ORAL | Status: DC

## 2015-10-21 NOTE — Progress Notes (Signed)
Pre visit review using our clinic review tool, if applicable. No additional management support is needed unless otherwise documented below in the visit note. 

## 2015-10-21 NOTE — Patient Instructions (Signed)
Our office will contact you regarding referral to orthopedic surgeon and MRI of left shoulder We will also refer you Dr. Posey Pronto for EMG Avoiding lifting or repetitive motions with left arm

## 2015-10-21 NOTE — Progress Notes (Signed)
Subjective:    Patient ID: Samantha Peterson, female    DOB: January 12, 1966, 50 y.o.   MRN: [id number]  HPI  50 year old Asian female presents with chronic left shoulder pain and left wrist numbness. History obtained through translator. She also accompanied by her daughter. Her symptoms ongoing for 4-5 months. She denies any specific injury or trauma. 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 Over the last month, her left shoulder pain has gotten significantly worse. Patient also unable to lift left arm above 90 and pain with reaching behind her to her back.  She has been taking OTC advil.  She denies GI upset.  She is also getting acupuncture treatments.  She is concerned she is experiencing intermittent numbness and tingling in her left wrist and hand. She also describes pins and needle sensation in her forearm. Left forearm can also feel hot or cold ( abnormal temperature sensation.  Patient denies previous left shoulder injury. She has had issues with left hand numbness in year 2000 when she worked at [employer]. Her work duties involved repetitive hand / wrist motions.  Review of Systems Negative for neck pain,  Negative for left hand weakness    Past Medical History  Diagnosis Date  . Osteopenia   . Kidney stones     Social History   Social History  . Marital Status: Married    Spouse Name: N/A  . Number of Children: N/A  . Years of Education: N/A   Occupational History  . Self Employed    Social History Main Topics  . Smoking status: Never Smoker   . Smokeless tobacco: Not on file  . Alcohol Use: No  . Drug Use: No  . Sexual Activity: Not on file   Other Topics Concern  . 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  Family History  Problem Relation Age of Onset  . Lung cancer Mother   . Tuberculosis Mother     . Breast cancer Neg Hx   . Colon cancer Neg Hx     No Known Allergies  Current Outpatient Prescriptions on File Prior to Visit  Medication Sig Dispense Refill  . Ascorbic Acid (VITAMIN C) 100 MG tablet Take 100 mg by mouth daily.    . cholecalciferol (VITAMIN D) 1000 UNITS tablet Take 1,000 Units by mouth daily.    . Cyanocobalamin (VITAMIN B 12 PO) Take by mouth.     No current facility-administered medications on file prior to visit.    BP 100/70 mmHg  Pulse 60  Temp(Src) 97.9 F (36.6 C) (Oral)  Wt 114 lb 9.6 oz (51.982 kg)    Objective:   Physical Exam  Constitutional: She appears well-developed and well-nourished. No distress.  Cardiovascular: Normal rate, regular rhythm and normal heart sounds.   No murmur heard. Pulmonary/Chest: Effort normal and breath sounds normal. She has no wheezes.  Neurological:  Negative Phalen's and Tinel's test - left wrist Muscle strength in hands and wrists are 5/5 bilaterally Tenderness of left AC joint area.  Patient unable to abduct left arm past 90 degrees.  Discomfort and limited ROM - left shoulder internal rotation  Normal ROM of cervical spine.    Skin: Skin is warm and dry.  Psychiatric: She has a normal mood and affect. Her  behavior is normal.        Assessment & Plan:    1.  Left shoulder pain - chronic 2.  Left hand / wrist numbness   Patient's symptoms and exam suggestive of possible tear of left supraspinatus. Obtain MRI of left shoulder and arrange follow-up with orthopedic specialist-Dr. Mardelle Matte.    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 She has negative Phalen's and Tinel's. She has symptoms in her left forearm. Consider pronator syndrome. Obtain EMG/nerve conduction study of left arm - Dr. Posey Pronto (neurologist).  Patient instructed to discuss both complaints with orthopedic specialist.  Use short course of prednisone.  We discussed potential side effects of prednisone.  Patient  understands to avoid lifting and repetitive movements of left lower arm.

## 2015-11-13 LAB — GLUCOSE, POCT (MANUAL RESULT ENTRY): POC Glucose: 159 mg/dl — AB (ref 70–99)

## 2015-11-14 ENCOUNTER — Encounter: Payer: Self-pay | Admitting: *Deleted

## 2015-11-14 DIAGNOSIS — Z139 Encounter for screening, unspecified: Secondary | ICD-10-CM

## 2015-11-14 NOTE — Congregational Nurse Program (Unsigned)
Congregational Nurse Program Note  Date of Encounter: 11/14/2015  Past Medical History: Past Medical History  Diagnosis Date  . Osteopenia   . Kidney stones     Encounter Details:     CNP Questionnaire - 11/13/15 1340    Patient Demographics   Is this a new or existing patient? New   Patient is considered a/an Immigrant   Race Asian   Patient Assistance   [facility] Patient Assistance [facility]   Patient's financial/insurance status Private Insurance Coverage   Uninsured Patient No   Patient referred to apply for the following financial assistance Not Applicable   Food insecurities addressed Not Applicable   Transportation assistance No   Assistance securing medications No   Educational health offerings Nutrition;Not Applicable   Encounter Details   Primary purpose of visit Spiritual Care/Support Visit   Was an Emergency Department visit averted? Not Applicable   Does patient have a medical provider? No   Patient referred to Not Applicable   Was a mental health screening completed? (GAINS tool) No   Does patient have dental issues? No   Since previous encounter, have you referred patient for abnormal blood pressure that resulted in a new diagnosis or medication change? No   Since previous encounter, have you referred patient for abnormal blood glucose that resulted in a new diagnosis or medication change? No   For Abstraction Use Only   Does patient have insurance? Yes    stopped by office, screen BP and CBG Said irregular lunch time due to business, when she eats, a lot of rice due to hungry. Nutrition information given regarding DM, need more protein and healthy fat than carbohydrate. CBG 159, non fasting.  Will f/u next visit before eat lunch.

## 2016-07-23 ENCOUNTER — Encounter: Payer: Self-pay | Admitting: *Deleted

## 2017-02-20 ENCOUNTER — Encounter: Payer: Self-pay | Admitting: Gynecology

## 2017-03-13 ENCOUNTER — Encounter: Payer: Self-pay | Admitting: *Deleted

## 2017-03-13 ENCOUNTER — Encounter: Payer: Self-pay | Admitting: Internal Medicine

## 2017-03-13 NOTE — Congregational Nurse Program (Unsigned)
Congregational Nurse Program Note  Date of Encounter: 03/13/2017  Past Medical History: Past Medical History:  Diagnosis Date  . Kidney stones   . Osteopenia     Encounter Details:     CNP Questionnaire - 03/13/17 1127      Patient Demographics   Is this a new or existing patient? Existing   Patient is considered a/an Immigrant   Race Asian     Patient Assistance   Location of Patient Assistance Not Applicable   Patient's financial/insurance status Private Insurance Coverage   Uninsured Patient ([place] [employer]/Care Connects) No   Patient referred to apply for the following financial assistance Not Applicable   Food insecurities addressed Not Applicable   Transportation assistance No   Assistance securing medications No   Educational health offerings Not Applicable     Encounter Details   Primary purpose of visit [place]   Was an Emergency Department visit averted? Not Applicable   Does patient have a medical provider? Yes   Patient referred to Clinic   Was a mental health screening completed? (GAINS tool) No   Does patient have dental issues? No   Does patient have vision issues? No   Does your patient have an abnormal blood pressure today? No   Since previous encounter, have you referred patient for abnormal blood pressure that resulted in a new diagnosis or medication change? No   Does your patient have an abnormal blood glucose today? No   Since previous encounter, have you referred patient for abnormal blood glucose that resulted in a new diagnosis or medication change? No   Was there a life-saving intervention made? No     pt. Need referral to GI Dr. For Endoscopy. Her primary Dr. Is in [place], so pt. Requested GI Dr. In [place] to test endo scopy. For regular screen.

## 2017-03-20 ENCOUNTER — Telehealth: Payer: Self-pay | Admitting: *Deleted

## 2017-03-20 ENCOUNTER — Ambulatory Visit: Payer: BLUE CROSS/BLUE SHIELD | Admitting: *Deleted

## 2017-03-20 NOTE — Telephone Encounter (Signed)
Pt in [place] today with interpreter - she is insisting she have an EGD with her colon 6-19- she has no GI hx here. Per interpreter, pt had some abdominal procedure in [place], and pt wants her stomach checked to see if everything is ok. She also is complaining of GERD and states she has to have both procedures on the same day .  Since pt has no GI hx with [place], and she has many questions about EGD, OV scheduled to discuss these issues with PA 03-26-17 at 3 pm. Pt instructed to check in at 245 pm on 3rd floor . I called language resources and scheduled an interpreter to accompany her to this 6-19 Samantha Peterson PV

## 2017-03-26 ENCOUNTER — Telehealth: Payer: Self-pay | Admitting: Emergency Medicine

## 2017-03-26 ENCOUNTER — Ambulatory Visit (INDEPENDENT_AMBULATORY_CARE_PROVIDER_SITE_OTHER): Payer: BLUE CROSS/BLUE SHIELD | Admitting: Gastroenterology

## 2017-03-26 ENCOUNTER — Encounter: Payer: BLUE CROSS/BLUE SHIELD | Admitting: Internal Medicine

## 2017-03-26 ENCOUNTER — Encounter: Payer: Self-pay | Admitting: Gastroenterology

## 2017-03-26 VITALS — BP 90/70 | HR 76 | Ht 64.5 in | Wt 110.4 lb

## 2017-03-26 DIAGNOSIS — R1013 Epigastric pain: Secondary | ICD-10-CM | POA: Diagnosis not present

## 2017-03-26 DIAGNOSIS — G8929 Other chronic pain: Secondary | ICD-10-CM | POA: Diagnosis not present

## 2017-03-26 DIAGNOSIS — Z1211 Encounter for screening for malignant neoplasm of colon: Secondary | ICD-10-CM | POA: Diagnosis not present

## 2017-03-26 DIAGNOSIS — R110 Nausea: Secondary | ICD-10-CM | POA: Diagnosis not present

## 2017-03-26 NOTE — Progress Notes (Addendum)
03/26/2017 Samantha Peterson [id number] 05-27-1966   HISTORY OF PRESENT ILLNESS:  This is a 51 year old [place] female who is new to our office. She was initially scheduled here for a nurse visit to schedule a screening colonoscopy. She's never had one in the past. She denies any lower GI complaints. She does, however, complain of intermittent epigastric abdominal pain on and off for most of her life. Describes it as having a weak or sensitive stomach.  She says that she's had multiple EGDs done in her country, the last being over 10 years ago and is interested in having one performed here in [place]. She says that she has had recent epigastric abdominal pain, last episode about 2 weeks ago. She describes it as a burning pain. Has some nausea and occasional reflux but not frequent. No vomiting. She says that when the pain is present it seems to radiate around to the left side of her back. She says that she cannot recall any significant findings on her previous EGDs, including H. Pylori.  Has taken some Tums for the pain and it may help some but not much.  Has not tried any other medication.  Has done some type of "herb bonding" to her epigastrium, which she thinks helps.  No dysphagia, weight loss.  Recent CBC and CMP within normal limits.  She's been the [place] for 18 years. She underwent a dry cleaning business. All of our visit was performed by interpreter who is present today.   Referred here by Dr. Di Kindle.   Past Medical History:  Diagnosis Date  . Kidney stones   . Osteopenia    History reviewed. No pertinent surgical history.  reports that she has never smoked. She has never used smokeless tobacco. Her alcohol and drug histories are not on file. family history includes Lung cancer in her mother; Tuberculosis in her mother. No Known Allergies    Outpatient Encounter Prescriptions as of 03/26/2017  Medication Sig  . Ascorbic Acid (VITAMIN C) 100 MG tablet Take 100 mg by mouth  daily.  . calcium carbonate (TUMS - DOSED IN MG ELEMENTAL CALCIUM) 500 MG chewable tablet Chew 1 tablet by mouth daily. As needed  . cholecalciferol (VITAMIN D) 1000 UNITS tablet Take 1,000 Units by mouth daily.  . Cyanocobalamin (VITAMIN B 12 PO) Take by mouth.  . [DISCONTINUED] ferrous sulfate 325 (65 FE) MG tablet Take 325 mg by mouth daily with breakfast.  . [DISCONTINUED] predniSONE (DELTASONE) 10 MG tablet 3 tabs once daily for 3 days, 2 tabs once daily for 3 days, then 1 tab once daily for 3 days   No facility-administered encounter medications on file as of 03/26/2017.     REVIEW OF SYSTEMS  : All other systems reviewed and negative except where noted in the History of Present Illness.   PHYSICAL EXAM: BP 90/70   Pulse 76   Ht 5' 4.5" (1.638 m)   Wt 110 lb 6.4 oz (50.1 kg)   LMP 09/11/2014   BMI 18.66 kg/m  General: Well developed [place] female in no acute distress Head: Normocephalic and atraumatic Eyes:  Sclerae anicteric, conjunctiva pink. Ears: Normal auditory acuity Lungs: Clear throughout to auscultation; no increased WOB. Heart: Regular rate and rhythm Abdomen: Soft, non-distended. Normal bowel sounds.  Non-tender. Rectal:  Will be done at the time of colonoscopy. Musculoskeletal: Symmetrical with no gross deformities  Skin: No lesions on visible extremities Extremities: No edema  Neurological: Alert oriented x 4, grossly non-focal  Psychological:  Alert and cooperative. Normal mood and affect  ASSESSMENT AND PLAN: -Screening for colon cancer:  Will schedule colonoscopy with Dr. Carlean Purl. -Chronic epigastric pain:  On and off for years.  Occasional heartburn and nausea.  Had several EGD's in her country but the last was over 10 years ago.  Patient is interested in EGD here.  Will schedule with Dr. Carlean Purl as well.  *The risks, benefits, and alternatives to EGD and colonoscopy were discussed with the patient and he consents to proceed.   CC:  Shawna Orleans, Doe-Hyun R, DO    CC:  Dr. Di Kindle  Agree with Ms. Alphia Kava management.  Gatha Mayer, MD, [facility]

## 2017-03-26 NOTE — Telephone Encounter (Signed)
Patient was seen today and needed to schedule an egd/colonoscopy, however she can only come on a Monday morning. Patient was adamant about scheduling on Monday and she will wait until the September schedule is available and will schedule her procedures then.

## 2017-03-26 NOTE — Patient Instructions (Signed)
You have been scheduled for an endoscopy and colonoscopy. Please follow the written instructions given to you at your visit today.

## 2017-05-08 ENCOUNTER — Other Ambulatory Visit: Payer: Self-pay | Admitting: Emergency Medicine

## 2017-06-23 ENCOUNTER — Encounter: Payer: Self-pay | Admitting: *Deleted

## 2017-06-23 DIAGNOSIS — Z23 Encounter for immunization: Secondary | ICD-10-CM

## 2017-07-15 ENCOUNTER — Ambulatory Visit (AMBULATORY_SURGERY_CENTER): Payer: Self-pay

## 2017-07-15 ENCOUNTER — Encounter: Payer: Self-pay | Admitting: Internal Medicine

## 2017-07-15 VITALS — Ht 64.0 in | Wt 112.2 lb

## 2017-07-15 DIAGNOSIS — Z1211 Encounter for screening for malignant neoplasm of colon: Secondary | ICD-10-CM

## 2017-07-15 DIAGNOSIS — R1013 Epigastric pain: Secondary | ICD-10-CM

## 2017-07-15 NOTE — Progress Notes (Signed)
Pre- visit was conducted with interpretor Eduard Roux.

## 2017-07-15 NOTE — Progress Notes (Signed)
Patient states that soy products make her itchy.  Denies complication of anesthesia or sedation. Denies use of weight loss medication. Denies use of O2.   Emmi instructions declined due to language barrier.

## 2017-07-22 ENCOUNTER — Ambulatory Visit (AMBULATORY_SURGERY_CENTER): Payer: BLUE CROSS/BLUE SHIELD | Admitting: Internal Medicine

## 2017-07-22 ENCOUNTER — Encounter: Payer: Self-pay | Admitting: Internal Medicine

## 2017-07-22 VITALS — BP 106/55 | HR 46 | Temp 97.8°F | Resp 12 | Ht 64.0 in | Wt 112.0 lb

## 2017-07-22 DIAGNOSIS — D120 Benign neoplasm of cecum: Secondary | ICD-10-CM

## 2017-07-22 DIAGNOSIS — Z1212 Encounter for screening for malignant neoplasm of rectum: Secondary | ICD-10-CM

## 2017-07-22 DIAGNOSIS — K2950 Unspecified chronic gastritis without bleeding: Secondary | ICD-10-CM | POA: Diagnosis not present

## 2017-07-22 DIAGNOSIS — R1013 Epigastric pain: Secondary | ICD-10-CM

## 2017-07-22 DIAGNOSIS — Z1211 Encounter for screening for malignant neoplasm of colon: Secondary | ICD-10-CM

## 2017-07-22 DIAGNOSIS — K317 Polyp of stomach and duodenum: Secondary | ICD-10-CM

## 2017-07-22 MED ORDER — SODIUM CHLORIDE 0.9 % IV SOLN: 500.0000 mL | INTRAVENOUS | Status: DC

## 2017-07-22 NOTE — Op Note (Signed)
[place] Patient Name: Samantha Peterson Procedure Date: 07/22/2017 8:47 AM MRN: [id number] Endoscopist: Gatha Mayer , MD Age: 51 Referring MD:  Date of Birth: [birth year] Gender: Female Account #: [id number] Procedure:                Upper GI endoscopy Indications:              Epigastric abdominal pain Medicines:                Propofol per Anesthesia, Monitored Anesthesia Care Procedure:                Pre-Anesthesia Assessment:                           - Prior to the procedure, a History and Physical                            was performed, and patient medications and                            allergies were reviewed. The patient's tolerance of                            previous anesthesia was also reviewed. The risks                            and benefits of the procedure and the sedation                            options and risks were discussed with the patient.                            All questions were answered, and informed consent                            was obtained. Prior Anticoagulants: The patient has                            taken no previous anticoagulant or antiplatelet                            agents. ASA Grade Assessment: II - A patient with                            mild systemic disease. After reviewing the risks                            and benefits, the patient was deemed in                            satisfactory condition to undergo the procedure.                           After obtaining informed consent, the endoscope was  passed under direct vision. Throughout the                            procedure, the patient's blood pressure, pulse, and                            oxygen saturations were monitored continuously. The                            Model GIF-HQ190 [id number]) scope was introduced                            through the mouth, and advanced to the second part                            of  duodenum. The upper GI endoscopy was                            accomplished without difficulty. The patient                            tolerated the procedure well. Scope In: Scope Out: Findings:                 A single diminutive sessile polyp with no stigmata                            of recent bleeding was found in the gastric body.                            The polyp was removed with a cold biopsy forceps.                            Resection and retrieval were complete. Verification                            of patient identification for the specimen was                            done. Estimated blood loss was minimal.                           The exam was otherwise without abnormality.                           The cardia and gastric fundus were normal on                            retroflexion. Complications:            No immediate complications. Estimated Blood Loss:     Estimated blood loss was minimal. Impression:               - A single gastric polyp. Resected and retrieved.                           -  The examination was otherwise normal. Recommendation:           - Patient has a contact number available for                            emergencies. The signs and symptoms of potential                            delayed complications were discussed with the                            patient. Return to normal activities tomorrow.                            Written discharge instructions were provided to the                            patient.                           - Resume previous diet.                           - Continue present medications.                           - Try FD Donald Prose as needed fopr epigastric pain.                           - See the other procedure note for documentation of                            additional recommendations.                           - Will cc Dr. Nicanor Alcon, MD 07/22/2017 9:26:01 AM This report has been signed  electronically.

## 2017-07-22 NOTE — Progress Notes (Signed)
Called to room to assist during endoscopic procedure.  Patient ID and intended procedure confirmed with present staff. Received instructions for my participation in the procedure from the performing physician.  

## 2017-07-22 NOTE — Op Note (Signed)
[place] Patient Name: Samantha Peterson Procedure Date: 07/22/2017 8:44 AM MRN: [id number] Endoscopist: Gatha Mayer , MD Age: 51 Referring MD:  Date of Birth: [birth year] Gender: Female Account #: [id number] Procedure:                Colonoscopy Indications:              Screening for colorectal malignant neoplasm, This                            is the patient's first colonoscopy Medicines:                Propofol per Anesthesia, Monitored Anesthesia Care Procedure:                Pre-Anesthesia Assessment:                           - Prior to the procedure, a History and Physical                            was performed, and patient medications and                            allergies were reviewed. The patient's tolerance of                            previous anesthesia was also reviewed. The risks                            and benefits of the procedure and the sedation                            options and risks were discussed with the patient.                            All questions were answered, and informed consent                            was obtained. Prior Anticoagulants: The patient has                            taken no previous anticoagulant or antiplatelet                            agents. ASA Grade Assessment: II - A patient with                            mild systemic disease. After reviewing the risks                            and benefits, the patient was deemed in                            satisfactory condition to undergo the procedure.  After obtaining informed consent, the colonoscope                            was passed under direct vision. Throughout the                            procedure, the patient's blood pressure, pulse, and                            oxygen saturations were monitored continuously. The                            Model PCF-H190DL [id number]) scope was introduced   through the anus and advanced to the the cecum,                            identified by appendiceal orifice and ileocecal                            valve. The colonoscopy was performed without                            difficulty. The patient tolerated the procedure                            well. The quality of the bowel preparation was                            good. The ileocecal valve, appendiceal orifice, and                            rectum were photographed. The colonoscopy was                            performed without difficulty. Scope In: 8:58:02 AM Scope Out: 9:15:06 AM Scope Withdrawal Time: 0 hours 13 minutes 40 seconds  Total Procedure Duration: 0 hours 17 minutes 4 seconds  Findings:                 The perianal and digital rectal examinations were                            normal.                           Two sessile polyps were found in the cecum. The                            polyps were diminutive in size. These polyps were                            removed with a cold snare. Resection and retrieval                            were complete. Verification of patient  identification for the specimen was done. Estimated                            blood loss was minimal.                           The exam was otherwise without abnormality on                            direct and retroflexion views. Complications:            No immediate complications. Estimated Blood Loss:     Estimated blood loss was minimal. Impression:               - Two diminutive polyps in the cecum, removed with                            a cold snare. Resected and retrieved.                           - The examination was otherwise normal on direct                            and retroflexion views. Recommendation:           - Patient has a contact number available for                            emergencies. The signs and symptoms of potential                             delayed complications were discussed with the                            patient. Return to normal activities tomorrow.                            Written discharge instructions were provided to the                            patient.                           - Resume previous diet.                           - Continue present medications.                           - Repeat colonoscopy is recommended. The                            colonoscopy date will be determined after pathology                            results from today's exam become available for  review. Gatha Mayer, MD 07/22/2017 9:28:31 AM This report has been signed electronically.

## 2017-07-22 NOTE — Patient Instructions (Addendum)
I found a tiny stomach polyps and 2 tiny colon polyps. These do not look like a problem - they were removed and will be analyzed to see what they were.  I want you to try FD Donald Prose for your stomach.  I appreciate the opportunity to care for you. Gatha Mayer, MD, [facility]  Polyp handout given to patient.  Resume previous diet. Continue present medications.  Try FD Guard as needed for epigastric pain, available over the counter.  Repeat colonoscopy recommended.  Date to be determined after pathology results reviewed.   YOU HAD AN ENDOSCOPIC PROCEDURE TODAY AT [facility]:   Refer to the procedure report that was given to you for any specific questions about what was found during the examination.  If the procedure report does not answer your questions, please call your gastroenterologist to clarify.  If you requested that your care partner not be given the details of your procedure findings, then the procedure report has been included in a sealed envelope for you to review at your convenience later.  YOU SHOULD EXPECT: Some feelings of bloating in the abdomen. Passage of more gas than usual.  Walking can help get rid of the air that was put into your GI tract during the procedure and reduce the bloating. If you had a lower endoscopy (such as a colonoscopy or flexible sigmoidoscopy) you may notice spotting of blood in your stool or on the toilet paper. If you underwent a bowel prep for your procedure, you may not have a normal bowel movement for a few days.  Please Note:  You might notice some irritation and congestion in your nose or some drainage.  This is from the oxygen used during your procedure.  There is no need for concern and it should clear up in a day or so.  SYMPTOMS TO REPORT IMMEDIATELY:   Following lower endoscopy (colonoscopy or flexible sigmoidoscopy):  Excessive amounts of blood in the stool  Significant tenderness or worsening of abdominal  pains  Swelling of the abdomen that is new, acute  Fever of 100F or higher   Following upper endoscopy (EGD)  Vomiting of blood or coffee ground material  New chest pain or pain under the shoulder blades  Painful or persistently difficult swallowing  New shortness of breath  Fever of 100F or higher  Black, tarry-looking stools  For urgent or emergent issues, a gastroenterologist can be reached at any hour by calling [phone].   DIET:  We do recommend a small meal at first, but then you may proceed to your regular diet.  Drink plenty of fluids but you should avoid alcoholic beverages for 24 hours.  ACTIVITY:  You should plan to take it easy for the rest of today and you should NOT DRIVE or use heavy machinery until tomorrow (because of the sedation medicines used during the test).    FOLLOW UP: Our staff will call the number listed on your records the next business day following your procedure to check on you and address any questions or concerns that you may have regarding the information given to you following your procedure. If we do not reach you, we will leave a message.  However, if you are feeling well and you are not experiencing any problems, there is no need to return our call.  We will assume that you have returned to your regular daily activities without incident.  If any biopsies were taken you will be contacted by phone  or by letter within the next 1-3 weeks.  Please call us at [phone] if you have not heard about the biopsies in 3 weeks.    SIGNATURES/CONFIDENTIALITY: You and/or your care partner have signed paperwork which will be entered into your electronic medical record.  These signatures attest to the fact that that the information above on your After Visit Summary has been reviewed and is understood.  Full responsibility of the confidentiality of this discharge information lies with you and/or your care-partner.

## 2017-07-22 NOTE — Progress Notes (Signed)
Interpreter used today at the [facility] for this pt.  Interpreter's name is-Sunmi Lutricia Feil. Pt's states no medical or surgical changes since previsit or office visit.

## 2017-07-22 NOTE — Progress Notes (Signed)
Report to RN

## 2017-07-23 ENCOUNTER — Telehealth: Payer: Self-pay | Admitting: *Deleted

## 2017-07-23 NOTE — Telephone Encounter (Signed)
  Follow up Call-  Call back number 07/22/2017  Post procedure Call Back phone  # [phone]  Permission to leave phone message Yes  Some recent data might be hidden     Patient questions:  Do you have a fever, pain , or abdominal swelling? No. Pain Score  0 *  Have you tolerated food without any problems? Yes.    Have you been able to return to your normal activities? Yes.    Do you have any questions about your discharge instructions: Diet   No. Medications  No. Follow up visit  No.  Do you have questions or concerns about your Care? No.  Actions: * If pain score is 4 or above: No action needed, pain <4.

## 2017-07-26 ENCOUNTER — Encounter: Payer: Self-pay | Admitting: Internal Medicine

## 2017-07-26 DIAGNOSIS — Z86010 Personal history of colonic polyps: Secondary | ICD-10-CM

## 2017-07-26 HISTORY — DX: Personal history of colonic polyps: Z86.010

## 2017-07-26 NOTE — Progress Notes (Signed)
Gastric polyp gastritis no H pylori 2 diminutive colon adenomas  Recall 5 yrs colonoscopy

## 2018-07-15 ENCOUNTER — Encounter: Payer: Self-pay | Admitting: *Deleted

## 2020-06-19 ENCOUNTER — Encounter: Payer: Self-pay | Admitting: *Deleted

## 2022-08-10 ENCOUNTER — Encounter: Payer: Self-pay | Admitting: Internal Medicine
